# Patient Record
Sex: Female | Born: 1955 | Race: White | Hispanic: No | Marital: Married | Smoking: Never smoker
Health system: Southern US, Community
[De-identification: ages and names within clinical notes are randomized; demographics above are authoritative.]

## PROBLEM LIST (undated history)

## (undated) DIAGNOSIS — E119 Type 2 diabetes mellitus without complications: Secondary | ICD-10-CM

## (undated) DIAGNOSIS — I10 Essential (primary) hypertension: Secondary | ICD-10-CM

## (undated) HISTORY — PX: TUBAL LIGATION: SHX77

## (undated) HISTORY — PX: COLONOSCOPY: SHX174

---

## 2010-04-12 ENCOUNTER — Ambulatory Visit: Payer: Self-pay | Admitting: Family Medicine

## 2011-03-31 ENCOUNTER — Ambulatory Visit: Payer: Self-pay | Admitting: Family Medicine

## 2011-04-28 ENCOUNTER — Ambulatory Visit: Payer: Self-pay | Admitting: Family Medicine

## 2011-08-26 IMAGING — CR DG FOOT COMPLETE 3+V*L*
1 series · 3 of 3 positions shown · non-contrast
Comparison: none

REASON FOR EXAM: twisted foot
COMMENTS:

[Series 1: view not recorded · 0.17mm/px · 3 of 3 slices shown]
[im 1/3]
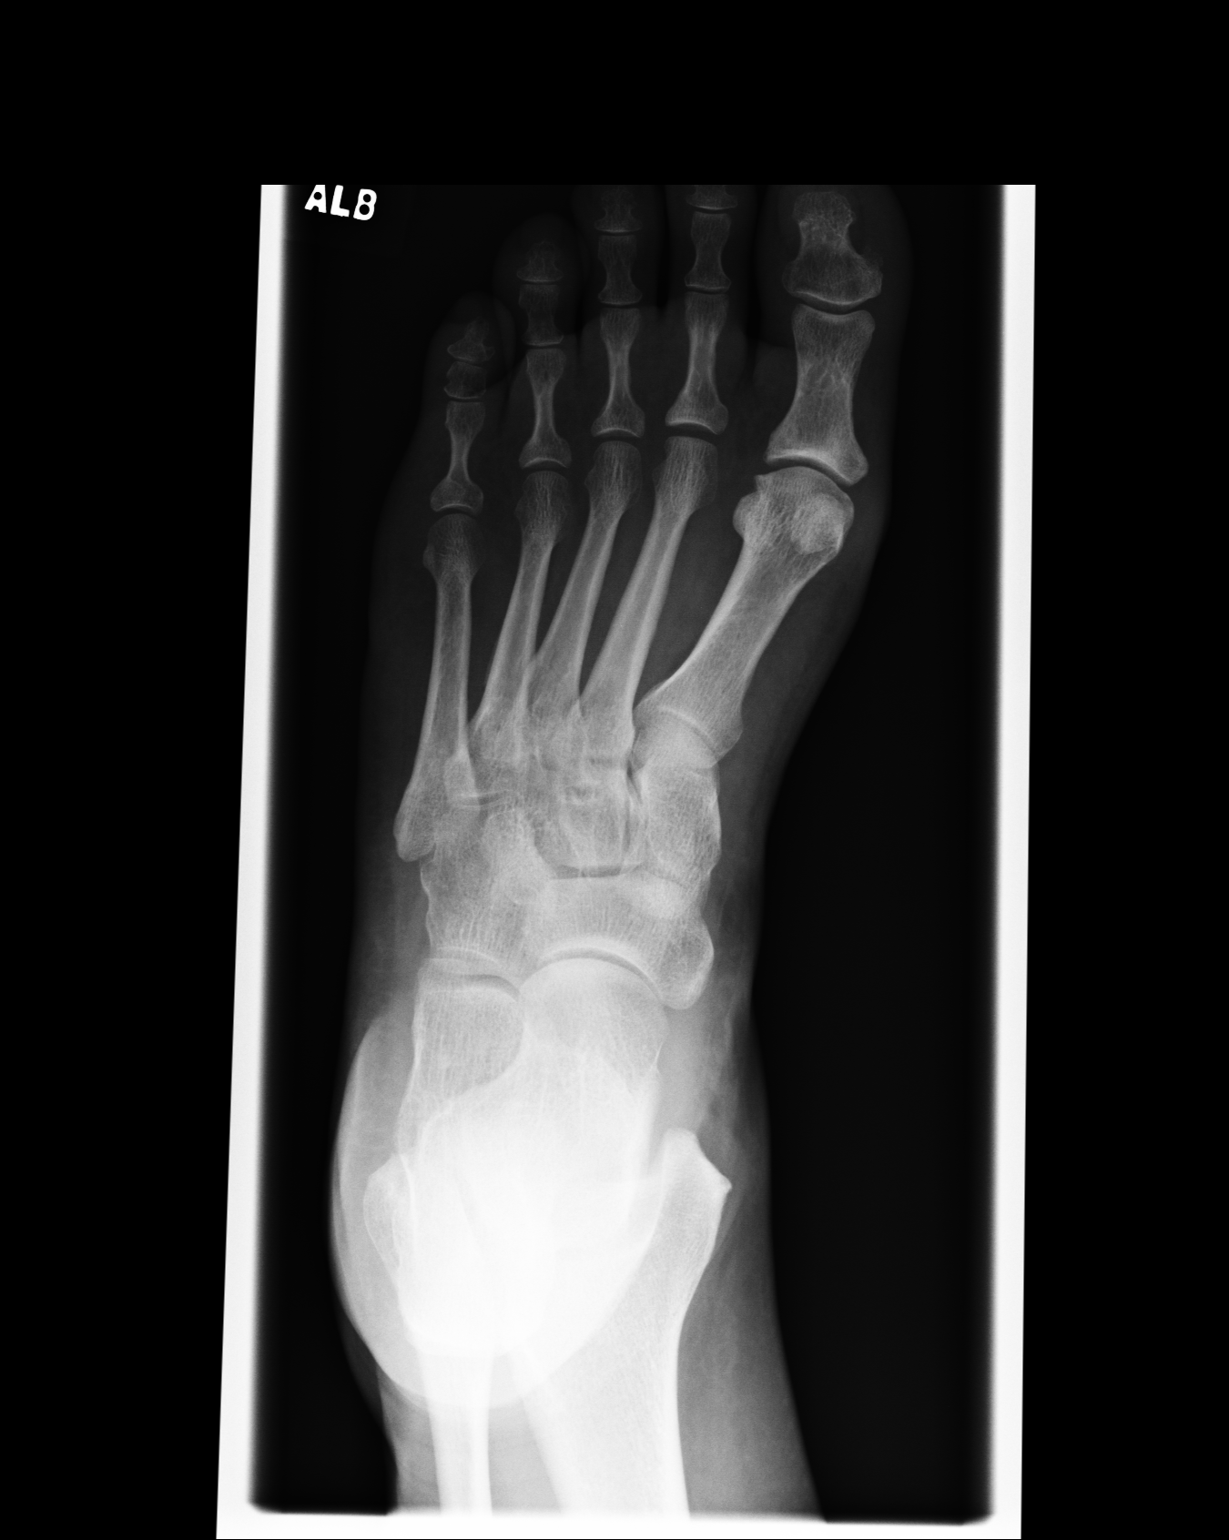
[im 2/3]
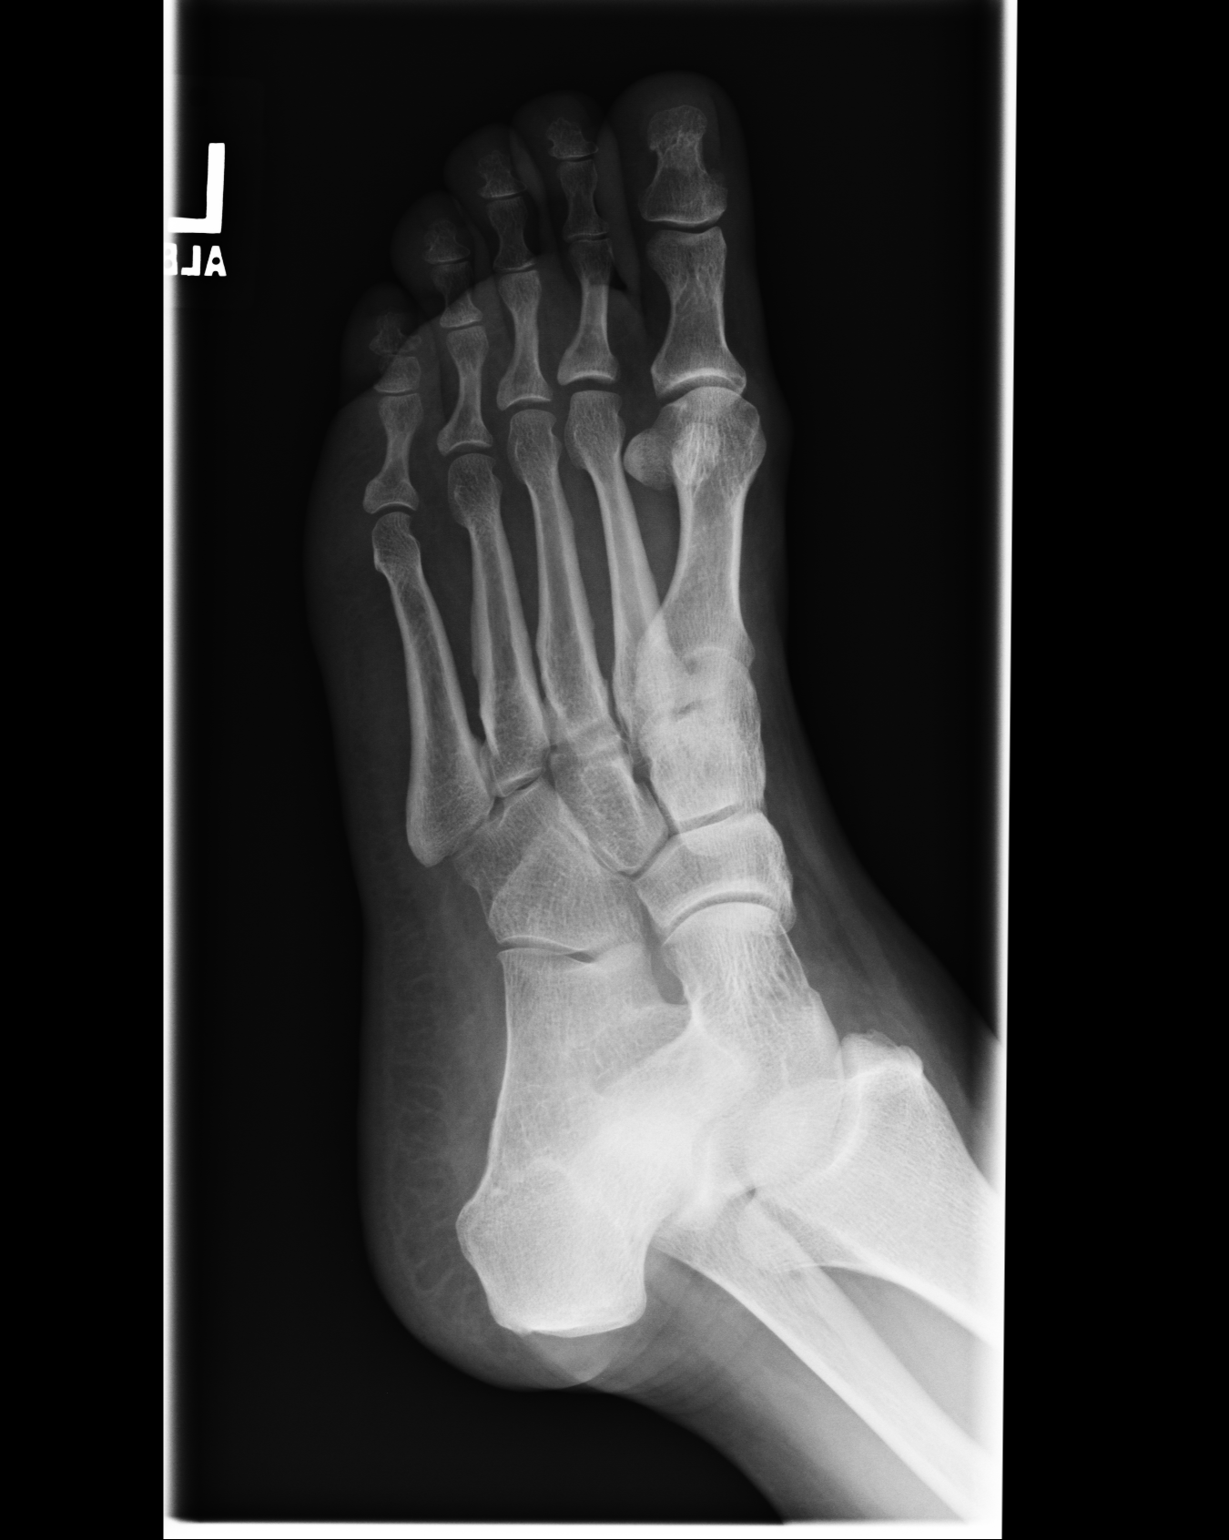
[im 3/3]
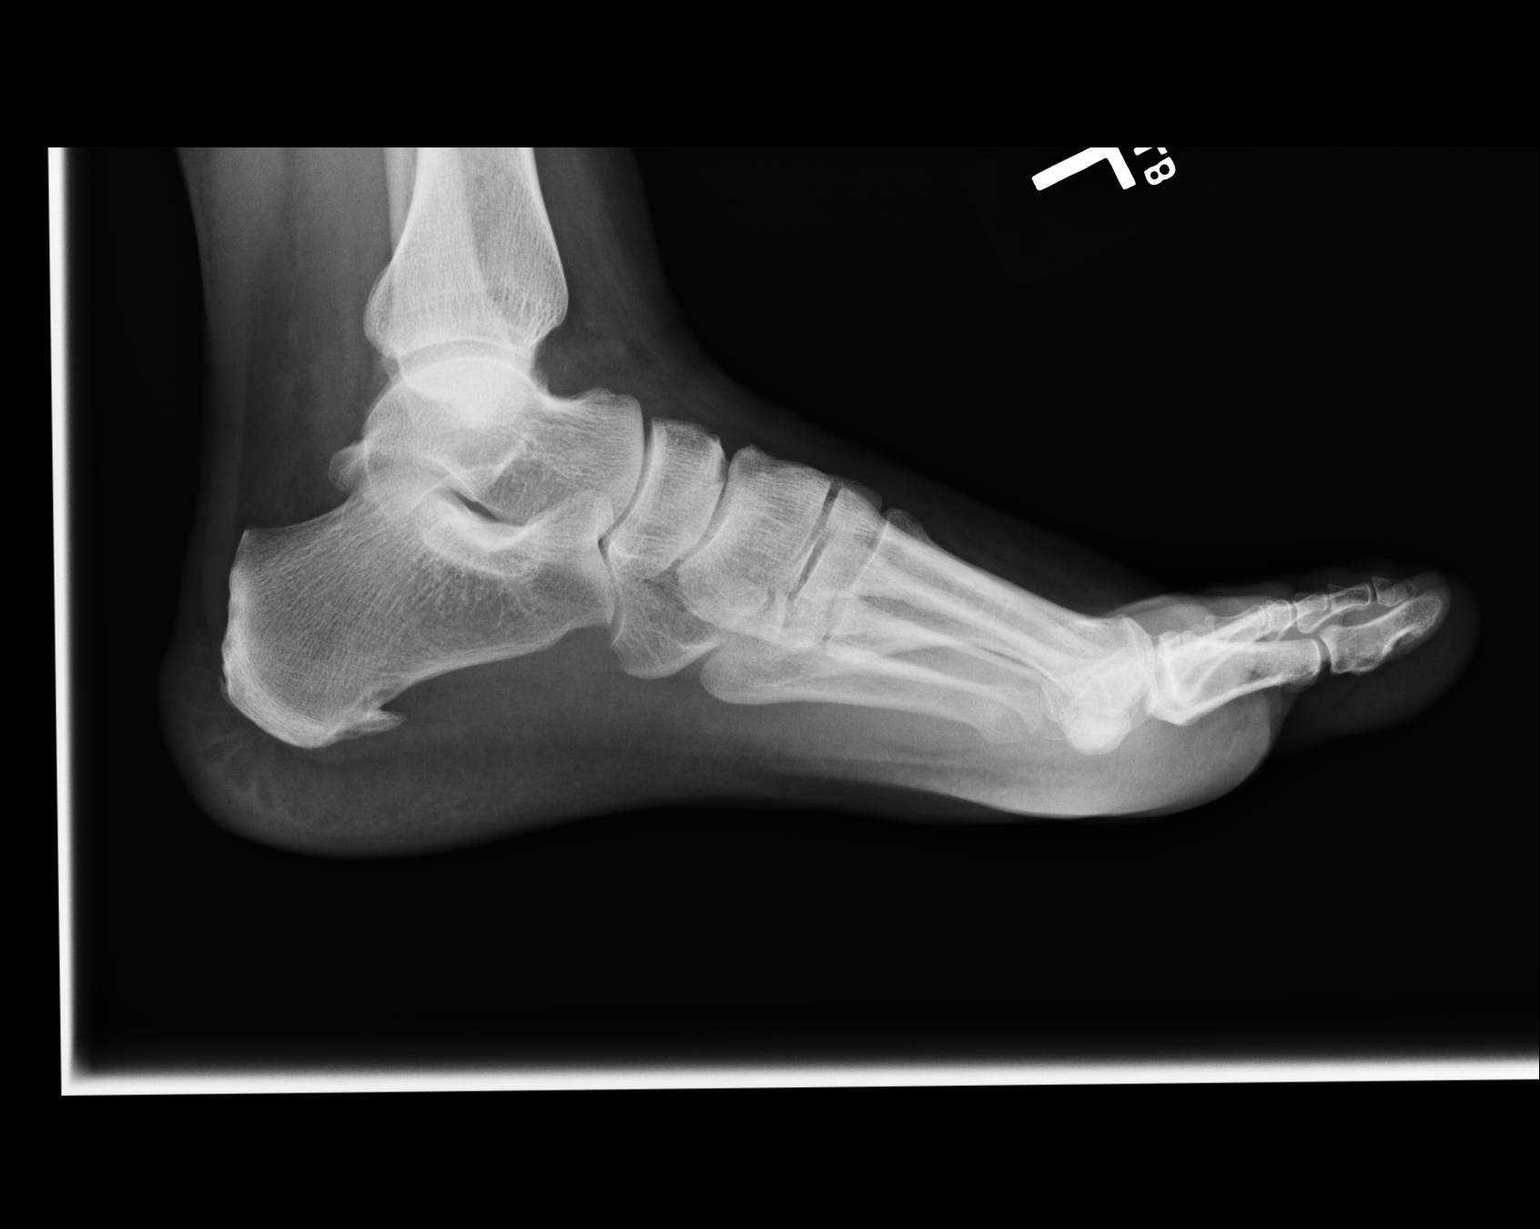

[3 of 3 positions shown; findings below may reference images not displayed]

PROCEDURE:     MDR - MDR FOOT LT COMP W/OBLQUES  - March 31, 2011  [DATE]

RESULT:     Three views of the left foot are submitted. The bones appear
well mineralized. The interphalangeal joints and the metatarsophalangeal
joints appear intact. The metatarsals exhibit no evidence of an acute
fracture nor dislocation. The tarsal bones exhibit no acute fracture. There
is a plantar calcaneal spur. The soft tissues the foot appear normal.
IMPRESSION: I see no acute bony abnormality of the left foot. If the
patient's symptoms persist and remain unexplained, followup imaging is
available upon request.

## 2012-07-10 ENCOUNTER — Ambulatory Visit: Payer: Self-pay | Admitting: Family Medicine

## 2012-08-18 ENCOUNTER — Ambulatory Visit: Payer: Self-pay | Admitting: Family Medicine

## 2013-08-17 ENCOUNTER — Ambulatory Visit: Payer: Self-pay | Admitting: Physician Assistant

## 2013-10-25 ENCOUNTER — Ambulatory Visit: Payer: Self-pay | Admitting: Family Medicine

## 2014-04-17 ENCOUNTER — Ambulatory Visit: Payer: Self-pay

## 2014-04-17 LAB — RAPID STREP-A WITH REFLX: Micro Text Report: NEGATIVE

## 2014-04-21 LAB — BETA STREP CULTURE(ARMC)

## 2015-03-11 ENCOUNTER — Other Ambulatory Visit: Payer: Self-pay | Admitting: Family Medicine

## 2015-03-11 ENCOUNTER — Ambulatory Visit
Admission: RE | Admit: 2015-03-11 | Discharge: 2015-03-11 | Disposition: A | Discharge status: Home / Self Care | Payer: BLUE CROSS/BLUE SHIELD | Source: Ambulatory Visit | Attending: Family Medicine | Admitting: Family Medicine

## 2015-03-11 DIAGNOSIS — Z1231 Encounter for screening mammogram for malignant neoplasm of breast: Secondary | ICD-10-CM | POA: Insufficient documentation

## 2015-05-27 ENCOUNTER — Encounter
Admission: RE | Admit: 2015-05-27 | Discharge: 2015-05-27 | Disposition: A | Discharge status: Home / Self Care | Payer: BLUE CROSS/BLUE SHIELD | Source: Ambulatory Visit | Attending: Obstetrics and Gynecology | Admitting: Obstetrics and Gynecology

## 2015-05-27 DIAGNOSIS — N840 Polyp of corpus uteri: Secondary | ICD-10-CM | POA: Insufficient documentation

## 2015-05-27 DIAGNOSIS — N950 Postmenopausal bleeding: Secondary | ICD-10-CM | POA: Insufficient documentation

## 2015-05-27 DIAGNOSIS — Z01818 Encounter for other preprocedural examination: Secondary | ICD-10-CM | POA: Diagnosis present

## 2015-05-27 HISTORY — DX: Essential (primary) hypertension: I10

## 2015-05-27 LAB — CBC
HCT: 41.9 % (ref 35.0–47.0)
Hemoglobin: 14.1 g/dL (ref 12.0–16.0)
MCH: 31 pg (ref 26.0–34.0)
MCHC: 33.7 g/dL (ref 32.0–36.0)
MCV: 91.9 fL (ref 80.0–100.0)
Platelets: 240 10*3/uL (ref 150–440)
RBC: 4.55 MIL/uL (ref 3.80–5.20)
RDW: 12.9 % (ref 11.5–14.5)
WBC: 7 10*3/uL (ref 3.6–11.0)

## 2015-05-27 LAB — BASIC METABOLIC PANEL
Anion gap: 8 (ref 5–15)
BUN: 15 mg/dL (ref 6–20)
CALCIUM: 9.4 mg/dL (ref 8.9–10.3)
CHLORIDE: 104 mmol/L (ref 101–111)
CO2: 28 mmol/L (ref 22–32)
CREATININE: 0.7 mg/dL (ref 0.44–1.00)
GFR calc Af Amer: >60 mL/min (ref >60)
GFR calc non Af Amer: >60 mL/min (ref >60)
GLUCOSE: 112 mg/dL — AB (ref 65–99)
Potassium: 4.1 mmol/L (ref 3.5–5.1)
Sodium: 140 mmol/L (ref 135–145)

## 2015-05-27 LAB — ABO/RH: ABO/RH(D): O POS

## 2015-05-27 NOTE — H&P (Signed)
  Katherine Caldwell is a 59 y.o. female here Fractional D+C +endometrial polypectomy  . Marland Kitchen PT underwent an EMBX which was negative . Previous cervical polypectomy .   Past Medical History:  has a past medical history of Hypertension and Hyperglycemia (06/27/2012).  Past Surgical History:  has past surgical history that includes no surgical history and Tubal ligation. Family History: family history includes Cervical cancer in her sister; Diabetes type II in her brother, father, and mother; Glaucoma in her father; Hypertension in her father and mother; Stroke in her father and mother. Social History:  reports that she has never smoked. She has never used smokeless tobacco. She reports that she drinks alcohol. She reports that she does not use illicit drugs. OB/GYN History:  OB History    Gravida Para Term Preterm AB TAB SAB Ectopic Multiple Living   Allergies: is allergic to aspirin. Medications:  Current outpatient prescriptions:  . amLODIPine (NORVASC) 5 MG tablet, Take 1 tablet (5 mg total) by mouth once daily., Disp: 30 tablet, Rfl: 11 . atenolol (TENORMIN) 50 MG tablet, Take 1 tablet (50 mg total) by mouth once daily., Disp: 30 tablet, Rfl: 11 . EPINEPHrine (EPIPEN) 0.3 mg/0.3 mL pen injector, Inject 0.3 mg into the muscle once as needed for Anaphylaxis., Disp: , Rfl:  . fluticasone (FLONASE) 50 mcg/actuation nasal spray, Place 2 sprays into both nostrils 2 (two) times daily., Disp: 16 g, Rfl: 2 . medroxyPROGESTERone (PROVERA) 10 MG tablet, Take 1 tablet (10 mg total) by mouth once daily., Disp: 10 tablet, Rfl: 0  Review of Systems: General:   No fatigue or weight loss Eyes:   No vision changes Ears:   No hearing difficulty Respiratory:   No cough or shortness of breath Pulmonary:   No asthma or shortness of breath Cardiovascular:  No chest pain, palpitations, dyspnea on exertion Gastrointestinal:  No abdominal bloating, chronic diarrhea,  constipations, masses, pain or hematochezia Genitourinary:  No hematuria, dysuria, abnormal vaginal discharge, pelvic pain, Menometrorrhagia Lymphatic:  No swollen lymph nodes Musculoskeletal: No muscle weakness Neurologic:  No extremity weakness, syncope, seizure disorder Psychiatric:  No history of depression, delusions or suicidal/homicidal ideation   Exam:      Filed Vitals:   05/27/2015  BP: 149/83    Body mass index is 33.28 kg/(m^2).  WDWN white/ black female in NAD  Lungs: CTA  CV : RRR without murmur  Pelvic: tanner stage 5 ,  External genitalia: vulva /labia no lesions Urethra: no prolapse Vagina: normal physiologic d/c Cervix: , no cervical motion tenderness,small endocx polyp noted   Uterus: normal size shape and contour, non-tender Adnexa: no mass, non-tender  SIS Endometrial polyp seen 6x4x7 mm, otherwise fibroid noted 18x11x23 mm  Impression:   The primary encounter diagnosis was PMB (postmenopausal bleeding). Diagnoses of Cervical polyp and Endometrial polyp     Plan:   Fx D+C and endometrial polypectomy. Pt has been counseled regarding the risks of the procedure . All questions answered .

## 2015-05-27 NOTE — Patient Instructions (Signed)
  Your procedure is scheduled on: 06/01/15 Report to Day Surgery. To find out your arrival time please call [phone] between 1PM - 3PM on 05/29/15.  Remember: Instructions that are not followed completely may result in serious medical risk, up to and including death, or upon the discretion of your surgeon and anesthesiologist your surgery may need to be rescheduled.    __x__ 1. Do not eat food or drink liquids after midnight. No gum chewing or hard candies.     __x__ 2. No Alcohol for 24 hours before or after surgery.   ____ 3. Bring all medications with you on the day of surgery if instructed.    __x__ 4. Notify your doctor if there is any change in your medical condition     (cold, fever, infections).     Do not wear jewelry, make-up, hairpins, clips or nail polish.  Do not wear lotions, powders, or perfumes. You may wear deodorant.  Do not shave 48 hours prior to surgery. Men may shave face and neck.  Do not bring valuables to the hospital.    [facility] is not responsible for any belongings or valuables.               Contacts, dentures or bridgework may not be worn into surgery.  Leave your suitcase in the car. After surgery it may be brought to your room.  For patients admitted to the hospital, discharge time is determined by your                treatment team.   Patients discharged the day of surgery will not be allowed to drive home.   Please read over the following fact sheets that you were given:   Surgical Site Infection Prevention   ____ Take these medicines the morning of surgery with A SIP OF WATER:    1. amlodipine  2. atenolol  3.   4.  5.  6.  ____ Fleet Enema (as directed)   ____ Use CHG Soap as directed  ____ Use inhalers on the day of surgery  ____ Stop metformin 2 days prior to surgery    ____ Take 1/2 of usual insulin dose the night before surgery and none on the morning of surgery.   ____ Stop Coumadin/Plavix/aspirin on  ____ Stop  Anti-inflammatories on   ____ Stop supplements until after surgery.    ____ Bring C-Pap to the hospital.

## 2015-05-28 LAB — TYPE AND SCREEN
ABO/RH(D): O POS
Antibody Screen: NEGATIVE

## 2015-06-01 ENCOUNTER — Ambulatory Visit: Payer: BLUE CROSS/BLUE SHIELD | Admitting: Anesthesiology

## 2015-06-01 ENCOUNTER — Encounter
Admission: RE | Disposition: A | Discharge status: Home / Self Care | Payer: Self-pay | Source: Ambulatory Visit | Attending: Obstetrics and Gynecology

## 2015-06-01 ENCOUNTER — Encounter: Payer: Self-pay | Admitting: *Deleted

## 2015-06-01 ENCOUNTER — Ambulatory Visit
Admission: RE | Admit: 2015-06-01 | Discharge: 2015-06-01 | Disposition: A | Discharge status: Home / Self Care | Payer: BLUE CROSS/BLUE SHIELD | Source: Ambulatory Visit | Attending: Obstetrics and Gynecology | Admitting: Obstetrics and Gynecology

## 2015-06-01 DIAGNOSIS — Z79899 Other long term (current) drug therapy: Secondary | ICD-10-CM | POA: Insufficient documentation

## 2015-06-01 DIAGNOSIS — N840 Polyp of corpus uteri: Secondary | ICD-10-CM | POA: Insufficient documentation

## 2015-06-01 DIAGNOSIS — R739 Hyperglycemia, unspecified: Secondary | ICD-10-CM | POA: Insufficient documentation

## 2015-06-01 DIAGNOSIS — Z8249 Family history of ischemic heart disease and other diseases of the circulatory system: Secondary | ICD-10-CM | POA: Diagnosis not present

## 2015-06-01 DIAGNOSIS — Z833 Family history of diabetes mellitus: Secondary | ICD-10-CM | POA: Diagnosis not present

## 2015-06-01 DIAGNOSIS — Z8049 Family history of malignant neoplasm of other genital organs: Secondary | ICD-10-CM | POA: Insufficient documentation

## 2015-06-01 DIAGNOSIS — Z820 Family history of epilepsy and other diseases of the nervous system: Secondary | ICD-10-CM | POA: Diagnosis not present

## 2015-06-01 DIAGNOSIS — Z886 Allergy status to analgesic agent status: Secondary | ICD-10-CM | POA: Insufficient documentation

## 2015-06-01 DIAGNOSIS — I10 Essential (primary) hypertension: Secondary | ICD-10-CM | POA: Insufficient documentation

## 2015-06-01 DIAGNOSIS — Z823 Family history of stroke: Secondary | ICD-10-CM | POA: Diagnosis not present

## 2015-06-01 DIAGNOSIS — N950 Postmenopausal bleeding: Secondary | ICD-10-CM | POA: Diagnosis not present

## 2015-06-01 DIAGNOSIS — N841 Polyp of cervix uteri: Secondary | ICD-10-CM | POA: Diagnosis not present

## 2015-06-01 HISTORY — PX: HYSTEROSCOPY WITH D & C: SHX1775

## 2015-06-01 SURGERY — DILATATION AND CURETTAGE /HYSTEROSCOPY
Anesthesia: General | Wound class: Clean Contaminated

## 2015-06-01 MED ORDER — ONDANSETRON HCL 4 MG/2ML IJ SOLN
4.0000 mg | Freq: Once | INTRAMUSCULAR | Status: AC | PRN
  Administered 2015-06-01: 4 mg via INTRAVENOUS

## 2015-06-01 MED ORDER — FENTANYL CITRATE (PF) 100 MCG/2ML IJ SOLN
INTRAMUSCULAR | Status: DC | PRN
  Administered 2015-06-01: 100 ug via INTRAVENOUS

## 2015-06-01 MED ORDER — DEXTROSE 5 % IV SOLN
1.0000 g | INTRAVENOUS | Status: AC
  Administered 2015-06-01: 1 g via INTRAVENOUS
  Filled 2015-06-01: qty 1

## 2015-06-01 MED ORDER — MIDAZOLAM HCL 2 MG/2ML IJ SOLN
INTRAMUSCULAR | Status: DC | PRN
  Administered 2015-06-01: 2 mg via INTRAVENOUS

## 2015-06-01 MED ORDER — SILVER NITRATE-POT NITRATE 75-25 % EX MISC
CUTANEOUS | Status: DC | PRN
  Administered 2015-06-01: 6
  Administered 2015-06-01: 2

## 2015-06-01 MED ORDER — SODIUM CHLORIDE 0.9 % IV SOLN
INTRAVENOUS | Status: DC | PRN
  Administered 2015-06-01 (×2): via INTRAVENOUS

## 2015-06-01 MED ORDER — FENTANYL CITRATE (PF) 100 MCG/2ML IJ SOLN
INTRAMUSCULAR | Status: AC
  Filled 2015-06-01: qty 2

## 2015-06-01 MED ORDER — DEXAMETHASONE SODIUM PHOSPHATE 4 MG/ML IJ SOLN
INTRAMUSCULAR | Status: DC | PRN
  Administered 2015-06-01: 10 mg via INTRAVENOUS

## 2015-06-01 MED ORDER — FENTANYL CITRATE (PF) 100 MCG/2ML IJ SOLN
25.0000 ug | INTRAMUSCULAR | Status: DC | PRN
  Administered 2015-06-01 (×2): 25 ug via INTRAVENOUS

## 2015-06-01 MED ORDER — FAMOTIDINE 20 MG PO TABS
ORAL_TABLET | ORAL | Status: AC
  Filled 2015-06-01: qty 1

## 2015-06-01 MED ORDER — ONDANSETRON HCL 4 MG/2ML IJ SOLN
INTRAMUSCULAR | Status: AC
  Filled 2015-06-01: qty 2

## 2015-06-01 MED ORDER — PROPOFOL 10 MG/ML IV BOLUS
INTRAVENOUS | Status: DC | PRN
  Administered 2015-06-01: 30 mg via INTRAVENOUS
  Administered 2015-06-01: 20 mg via INTRAVENOUS
  Administered 2015-06-01: 150 mg via INTRAVENOUS

## 2015-06-01 MED ORDER — FAMOTIDINE 20 MG PO TABS
20.0000 mg | ORAL_TABLET | Freq: Once | ORAL | Status: AC
  Administered 2015-06-01: 20 mg via ORAL

## 2015-06-01 MED ORDER — ONDANSETRON HCL 4 MG/2ML IJ SOLN
INTRAMUSCULAR | Status: DC | PRN
  Administered 2015-06-01: 4 mg via INTRAVENOUS

## 2015-06-01 MED ORDER — LACTATED RINGERS IV SOLN
INTRAVENOUS | Status: DC
  Administered 2015-06-01 (×2): via INTRAVENOUS

## 2015-06-01 MED ORDER — LIDOCAINE HCL (CARDIAC) 20 MG/ML IV SOLN
INTRAVENOUS | Status: DC | PRN
  Administered 2015-06-01: 100 mg via INTRAVENOUS

## 2015-06-01 SURGICAL SUPPLY — 19 items
CANISTER SUC SOCK COL 7IN (MISCELLANEOUS) ×3 IMPLANT
CANISTER SUCT 3000ML (MISCELLANEOUS) ×3 IMPLANT
CATH ROBINSON RED A/P 16FR (CATHETERS) ×3 IMPLANT
GLOVE BIO SURGEON STRL SZ8 (GLOVE) ×3 IMPLANT
GOWN STRL REUS W/ TWL LRG LVL3 (GOWN DISPOSABLE) ×1 IMPLANT
GOWN STRL REUS W/ TWL XL LVL3 (GOWN DISPOSABLE) ×1 IMPLANT
GOWN STRL REUS W/TWL LRG LVL3 (GOWN DISPOSABLE) ×2
GOWN STRL REUS W/TWL XL LVL3 (GOWN DISPOSABLE) ×2
KIT RM TURNOVER CYSTO AR (KITS) ×3 IMPLANT
MYOSURE LITE POLYP REMOVAL (MISCELLANEOUS) ×6 IMPLANT
PACK DNC HYST (MISCELLANEOUS) ×3 IMPLANT
PAD OB MATERNITY 4.3X12.25 (PERSONAL CARE ITEMS) ×3 IMPLANT
PAD PREP 24X41 OB/GYN DISP (PERSONAL CARE ITEMS) ×3 IMPLANT
SOL .9 NS 3000ML IRR  AL (IV SOLUTION) ×2
SOL .9 NS 3000ML IRR UROMATIC (IV SOLUTION) ×1 IMPLANT
TOWEL OR 17X26 4PK STRL BLUE (TOWEL DISPOSABLE) ×3 IMPLANT
TUBING CONNECTING 10 (TUBING) ×2 IMPLANT
TUBING CONNECTING 10' (TUBING) ×1
TUBING HYSTEROSCOPY DOLPHIN (MISCELLANEOUS) ×3 IMPLANT

## 2015-06-01 NOTE — Brief Op Note (Signed)
06/01/2015  11:19 AM  PATIENT:  Katherine Caldwell  59 y.o. female  PRE-OPERATIVE DIAGNOSIS:  PMB, ENDOMETRIAL POLYP  POST-OPERATIVE DIAGNOSIS:  PMB, ENDOMETRIAL POLYP, endocervical polyp  PROCEDURE:  Procedure(s): DILATATION AND CURETTAGE /HYSTEROSCOPY/MYOSURE (N/A), resection of endometrial polyps and cervical polypectomy   SURGEON:  Surgeon(s) and Role:    Suzy Bouchard, MD - Primary  PHYSICIAN ASSISTANT:   ASSISTANTS: none   ANESTHESIA:   general  EBL:  Total I/O In: 400 [I.V.:400] Out: 125 [Urine:100; Blood:25]  BLOOD ADMINISTERED:none  DRAINS: none   LOCAL MEDICATIONS USED:  NONE  SPECIMEN:  Source of Specimen:  endocervical polypectomy , endometrial polyp resection , endometrial curettings  DISPOSITION OF SPECIMEN:  PATHOLOGY  COUNTS:  YES  TOURNIQUET:  * No tourniquets in log *  DICTATION: .Other Dictation: Dictation Number verbal   PLAN OF CARE: Discharge to home after PACU  PATIENT DISPOSITION:  PACU - hemodynamically stable.   Delay start of Pharmacological VTE agent (>24hrs) due to surgical blood loss or risk of bleeding: not applicable

## 2015-06-01 NOTE — Discharge Instructions (Signed)
AMBULATORY SURGERY  DISCHARGE INSTRUCTIONS   1) The drugs that you were given will stay in your system until tomorrow so for the next 24 hours you should not:  A) Drive an automobile B) Make any legal decisions C) Drink any alcoholic beverage   2) You may resume regular meals tomorrow.  Today it is better to start with liquids and gradually work up to solid foods.  You may eat anything you prefer, but it is better to start with liquids, then soup and crackers, and gradually work up to solid foods.   3) Please notify your doctor immediately if you have any unusual bleeding, trouble breathing, redness and pain at the surgery site, drainage, fever, or pain not relieved by medication.    Additional Instructions:   Please contact your physician with any problems or Same Day Surgery at [phone], Monday through Friday 6 am to 4 pm, or [place] number at [phone].    Dilation and Curettage, Care After Refer to this sheet in the next few weeks. These instructions provide you with information on caring for yourself after your procedure. Your health care provider may also give you more specific instructions. Your treatment has been planned according to current medical practices, but problems sometimes occur. Call your health care provider if you have any problems or questions after your procedure. WHAT TO EXPECT AFTER THE PROCEDURE After your procedure, it is typical to have light cramping and bleeding. This may last for 2 days to 2 weeks after the procedure. HOME CARE INSTRUCTIONS   Do not drive for 24 hours.  Wait 1 week before returning to strenuous activities.  Take your temperature 2 times a day for 4 days and write it down. Provide these temperatures to your health care provider if you develop a fever.  Avoid long periods of standing.  Avoid heavy lifting, pushing, or pulling. Do not lift anything heavier than 10 pounds (4.5 kg).  Limit stair climbing  to once or twice a day.  Take rest periods often.  You may resume your usual diet.  Drink enough fluids to keep your urine clear or pale yellow.  Your usual bowel function should return. If you have constipation, you may:  Take a mild laxative with permission from your health care provider.  Add fruit and bran to your diet.  Drink more fluids.  Take showers instead of baths until your health care provider gives you permission to take baths.  Do not go swimming or use a hot tub until your health care provider approves.  Try to have someone with you or available to you the first 24-48 hours, especially if you were given a general anesthetic.  Do not douche, use tampons, or have sex (intercourse) for 2 weeks after the procedure.  Only take over-the-counter or prescription medicines as directed by your health care provider. Do not take aspirin. It can cause bleeding.  Follow up with your health care provider as directed. SEEK MEDICAL CARE IF:   You have increasing cramps or pain that is not relieved with medicine.  You have abdominal pain that does not seem to be related to the same area of earlier cramping and pain.  You have bad smelling vaginal discharge.  You have a rash.  You are having problems with any medicine. SEEK IMMEDIATE MEDICAL CARE IF:   You have bleeding that is heavier than a normal menstrual period.  You have a fever.  You have chest pain.  You  have shortness of breath.  You feel dizzy or feel like fainting.  You pass out.  You have pain in your shoulder strap area.  You have heavy vaginal bleeding with or without blood clots. MAKE SURE YOU:   Understand these instructions.  Will watch your condition.  Will get help right away if you are not doing well or get worse.   This information is not intended to replace advice given to you by your health care provider. Make sure you discuss any questions you have with your health care provider.     Document Released: 08/05/2000 Document Revised: 08/13/2013 Document Reviewed: 03/07/2013 Elsevier Interactive Patient Education [employer]

## 2015-06-01 NOTE — Anesthesia Preprocedure Evaluation (Addendum)
Anesthesia Evaluation  Patient identified by MRN, date of birth, ID band Patient awake    Reviewed: Allergy & Precautions, NPO status , Patient's Chart, lab work & pertinent test results, reviewed documented beta blocker date and time   Airway Mallampati: II  TM Distance: <3 FB     Dental  (+) Caps   Pulmonary  Sinus allergies   Pulmonary exam normal breath sounds clear to auscultation       Cardiovascular hypertension, Pt. on medications and Pt. on home beta blockers Normal cardiovascular exam     Neuro/Psych negative neurological ROS     GI/Hepatic negative GI ROS, Neg liver ROS,   Endo/Other  negative endocrine ROS  Renal/GU negative Renal ROS  negative genitourinary   Musculoskeletal negative musculoskeletal ROS (+)   Abdominal Normal abdominal exam  (+)   Peds negative pediatric ROS (+)  Hematology negative hematology ROS (+)   Anesthesia Other Findings   Reproductive/Obstetrics                            Anesthesia Physical Anesthesia Plan  ASA: II  Anesthesia Plan: General   Post-op Pain Management:    Induction: Intravenous  Airway Management Planned: LMA  Additional Equipment:   Intra-op Plan:   Post-operative Plan: Extubation in OR  Informed Consent: I have reviewed the patients History and Physical, chart, labs and discussed the procedure including the risks, benefits and alternatives for the proposed anesthesia with the patient or authorized representative who has indicated his/her understanding and acceptance.   Dental advisory given  Plan Discussed with: CRNA and Surgeon  Anesthesia Plan Comments:         Anesthesia Quick Evaluation

## 2015-06-01 NOTE — Anesthesia Procedure Notes (Signed)
Procedure Name: LMA Insertion Date/Time: 06/01/2015 10:45 AM Performed by: Junious Silk Pre-anesthesia Checklist: Patient identified, Patient being monitored, Timeout performed, Emergency Drugs available and Suction available Patient Re-evaluated:Patient Re-evaluated prior to inductionOxygen Delivery Method: Circle system utilized Preoxygenation: Pre-oxygenation with 100% oxygen Intubation Type: IV induction Ventilation: Mask ventilation without difficulty LMA: LMA inserted LMA Size: 3.5 Tube type: Oral Number of attempts: 1 Placement Confirmation: positive ETCO2 and breath sounds checked- equal and bilateral Tube secured with: Tape Dental Injury: Teeth and Oropharynx as per pre-operative assessment

## 2015-06-01 NOTE — Progress Notes (Signed)
Pt interviewed , labs wnl . NPO . Ready for surgery . All questions answered

## 2015-06-01 NOTE — Transfer of Care (Signed)
Immediate Anesthesia Transfer of Care Note  Patient: Katherine Caldwell  Procedure(s) Performed: Procedure(s): DILATATION AND CURETTAGE /HYSTEROSCOPY/MYOSURE (N/A)  Patient Location: PACU  Anesthesia Type:General  Level of Consciousness: awake and sedated  Airway & Oxygen Therapy: Patient Spontanous Breathing and Patient connected to face mask oxygen  Post-op Assessment: Report given to RN and Post -op Vital signs reviewed and stable  Post vital signs: Reviewed and stable  Last Vitals:  Filed Vitals:   06/01/15 0826  BP: 152/81  Pulse: 55  Temp: 36.7 C  Resp: 16    Complications: No apparent anesthesia complications

## 2015-06-02 LAB — SURGICAL PATHOLOGY

## 2015-06-02 NOTE — Anesthesia Postprocedure Evaluation (Signed)
  Anesthesia Post-op Note  Patient: Katherine Caldwell  Procedure(s) Performed: Procedure(s): DILATATION AND CURETTAGE /HYSTEROSCOPY/MYOSURE (N/A)  Anesthesia type:General  Patient location: PACU  Post pain: Pain level controlled  Post assessment: Post-op Vital signs reviewed, Patient's Cardiovascular Status Stable, Respiratory Function Stable, Patent Airway and No signs of Nausea or vomiting  Post vital signs: Reviewed and stable  Last Vitals:  Filed Vitals:   06/01/15 1401  BP: 140/74  Pulse: 64  Temp: 36.1 C  Resp: 16    Level of consciousness: awake, alert  and patient cooperative  Complications: No apparent anesthesia complications

## 2015-06-02 NOTE — Op Note (Signed)
NAMEGWYNDOLYN, Katherine Caldwell NO.:  [id number]  MEDICAL RECORD NO.:  [id number]  LOCATION:  [facility]                         FACILITY:  [facility]  PHYSICIAN:  Jennell Corner, MDDATE OF BIRTH:  06-18-1956  DATE OF PROCEDURE: DATE OF DISCHARGE:  06/01/2015                              OPERATIVE REPORT   PREOPERATIVE DIAGNOSIS: 1. Postmenopausal bleeding. 2. Endometrial and endocervical polyps.  POSTOPERATIVE DIAGNOSIS: 1. Postmenopausal bleeding. 2. Endometrial and endocervical polyps.  PROCEDURE PERFORMED: 1. Fractional dilation and curettage. 2. Endocervical polypectomy. 3. Endometrial resection of endometrial polyps with MyoSure.  SURGEON:  Jennell Corner, MD  ANESTHESIA:  LMA.  SURGEON:  Jennell Corner, M.D.  INDICATIONS:  A 59 year old, gravida 4, para 3 patient with postmenopausal bleeding and evidence, on saline infusion sonohysterography of endometrial polyps.  DESCRIPTION OF PROCEDURE:  After adequate LMA anesthesia, the patient was placed in dorsal supine position with the legs in the candy-cane stirrups.  The patient's abdomen, perineum, and vagina were prepped and draped in normal sterile fashion.  Prophylactic antibiotics with Ancef 1 g administered.  Time-out was performed.  A weighted speculum was placed in the posterior vaginal vault.  Anterior cervix was grasped with a single-tooth tenaculum.  Endocervical curettage was performed followed by removal of endocervical polyp, approximately 5 x 5 mm.  The cervix was then sounded to 8 cm and cervix was then dilated to #18 Hanks dilator.  Hysteroscope was advanced into the endometrial cavity.  Normal saline used as distending medium and there was evidence of 2 polyps, 1 approximately 7 x 6 mm left portion of the endometrial cavity and 1 was right anterior, measuring smaller probably by 4 x 4 mm.  The MyoSure was brought up into the operative field and advanced and MyoSure was used to remove  these polyps.  Good hemostasis was noted.  Intraoperative pictures were taken and the MyoSure and hysteroscope were removed.  Good hemostasis was noted.  Silver nitrate was applied to the tenaculum sites and the procedure was terminated.  INTRAOPERATIVE FLUIDS:  400 mL.  ESTIMATED BLOOD LOSS:  Minimal blood loss.  URINE OUTPUT:  100 mL.  The patient was catheterized prior to commencement of the case and yielded 100 mL of clear urine.  COMPLICATIONS:  None.  The patient was taken to the recovery room in good condition.          ______________________________ Jennell Corner, MD     TS/MEDQ  D:  06/01/2015  T:  06/02/2015  Job:  [id number]

## 2015-12-25 ENCOUNTER — Other Ambulatory Visit: Payer: Self-pay | Admitting: Family Medicine

## 2015-12-25 DIAGNOSIS — N644 Mastodynia: Secondary | ICD-10-CM

## 2016-01-04 ENCOUNTER — Ambulatory Visit
Admission: RE | Admit: 2016-01-04 | Discharge: 2016-01-04 | Disposition: A | Discharge status: Home / Self Care | Payer: BLUE CROSS/BLUE SHIELD | Source: Ambulatory Visit | Attending: Family Medicine | Admitting: Family Medicine

## 2016-01-04 DIAGNOSIS — N644 Mastodynia: Secondary | ICD-10-CM

## 2016-04-06 ENCOUNTER — Other Ambulatory Visit: Payer: Self-pay | Admitting: Gastroenterology

## 2016-04-06 DIAGNOSIS — R1310 Dysphagia, unspecified: Secondary | ICD-10-CM

## 2016-04-08 ENCOUNTER — Ambulatory Visit
Admission: RE | Admit: 2016-04-08 | Discharge: 2016-04-08 | Disposition: A | Discharge status: Home / Self Care | Payer: BLUE CROSS/BLUE SHIELD | Source: Ambulatory Visit | Attending: Gastroenterology | Admitting: Gastroenterology

## 2016-04-08 DIAGNOSIS — K219 Gastro-esophageal reflux disease without esophagitis: Secondary | ICD-10-CM | POA: Diagnosis not present

## 2016-04-08 DIAGNOSIS — R1310 Dysphagia, unspecified: Secondary | ICD-10-CM | POA: Diagnosis present

## 2016-05-31 IMAGING — MG MM DIAG BREAST TOMO UNI LEFT
7 series · 8 of 15 positions shown · non-contrast
Comparison: Previous examinations the most recent which is dated
03/11/2015.

CLINICAL DATA: The patient developed focal pain within the mid
axillary line of her left lateral chest wall following a viral
infection. She noticed no skin rash at this time. She is referred
for evaluation.

EXAM:
2D DIGITAL DIAGNOSTIC LEFT MAMMOGRAM WITH CAD AND ADJUNCT TOMO
ULTRASOUND LEFT BREAST

[L XCCL]
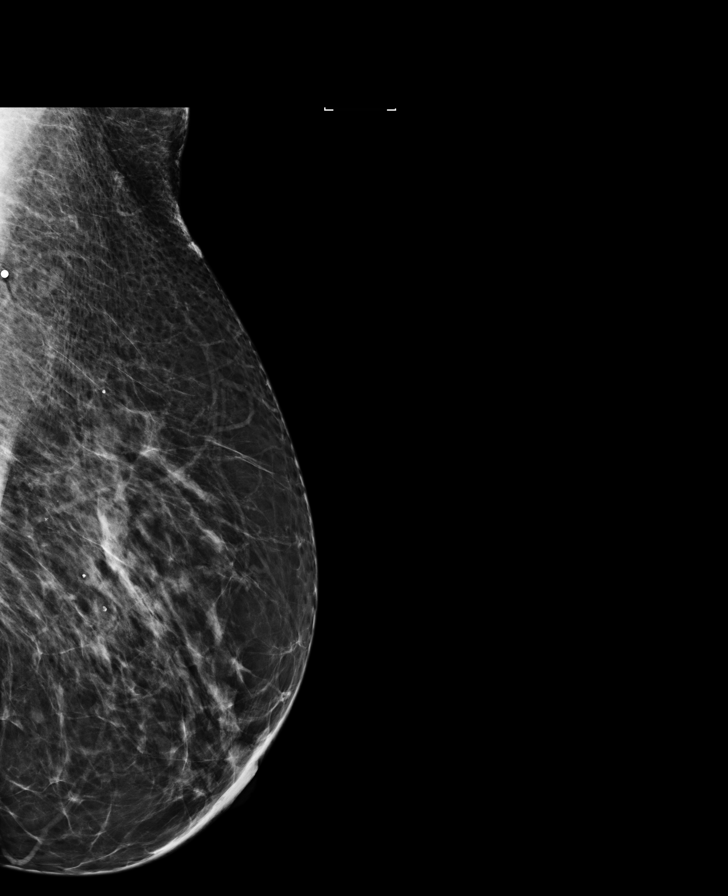

[L CC]
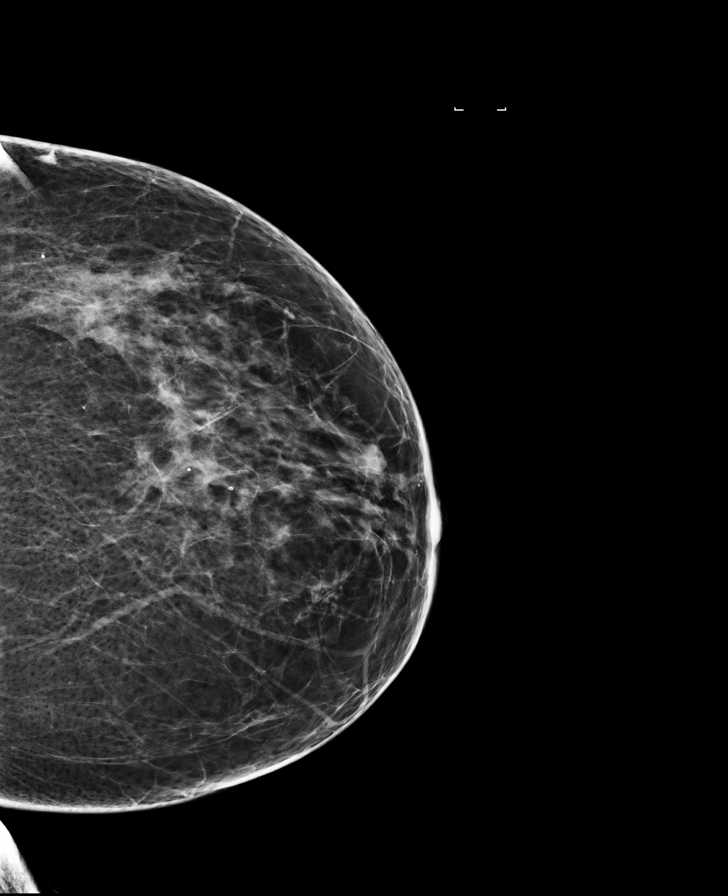

[L MLO]
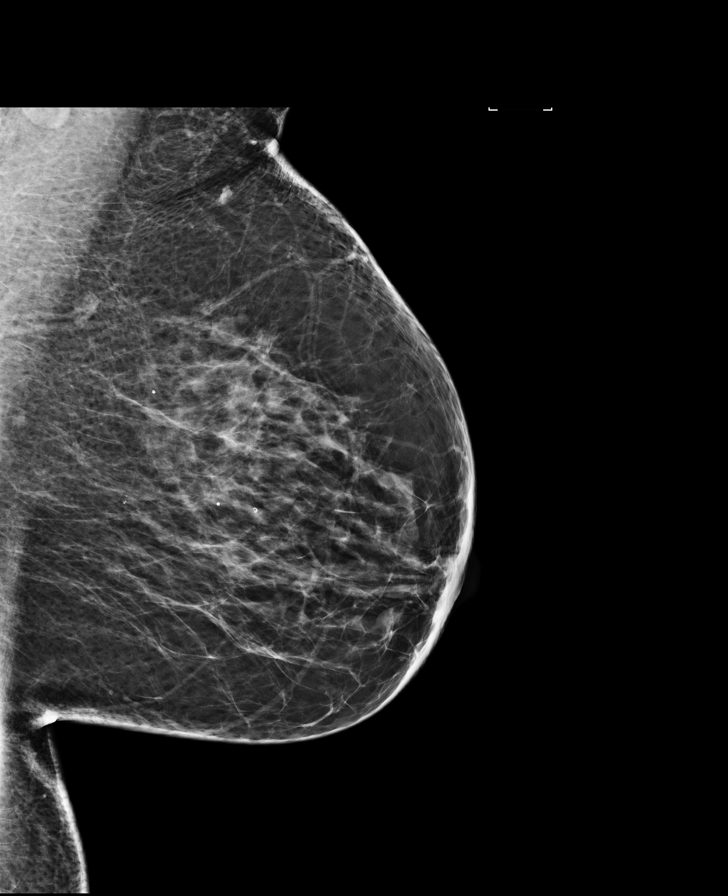

[L MLO synth-2D]
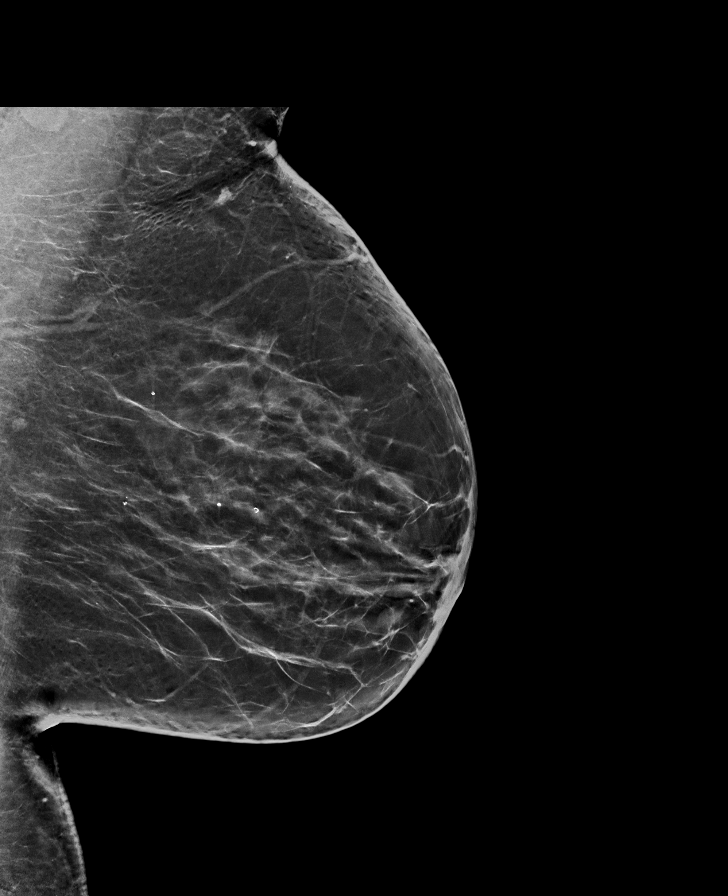

[L CC synth-2D]
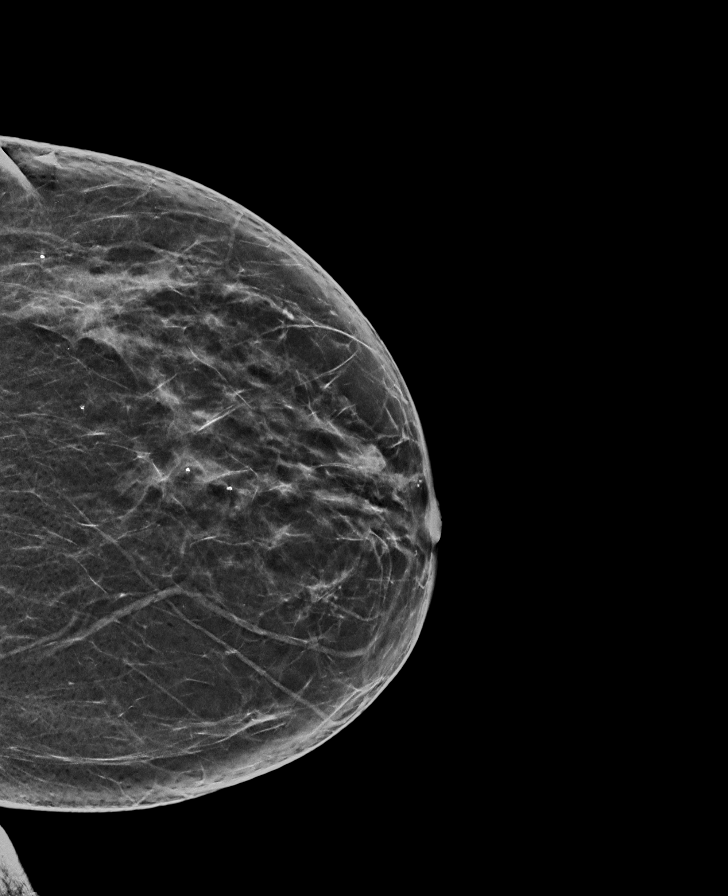

[L CC tomo · 2 of 70 frames shown]
[frame 23/70]
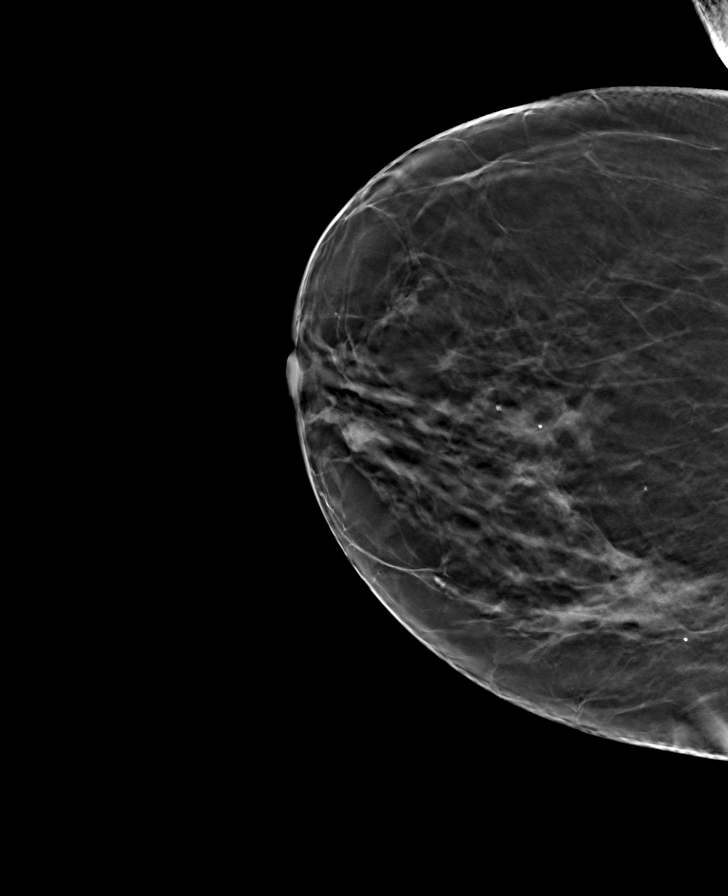
[frame 35/70]
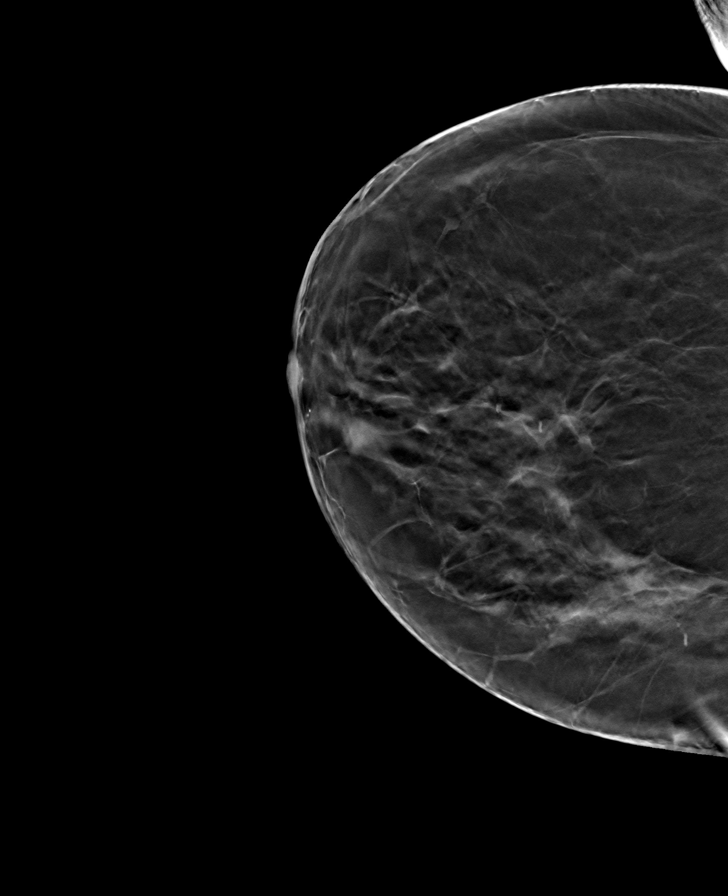

[L MLO tomo · tomo slice 43/85.0]
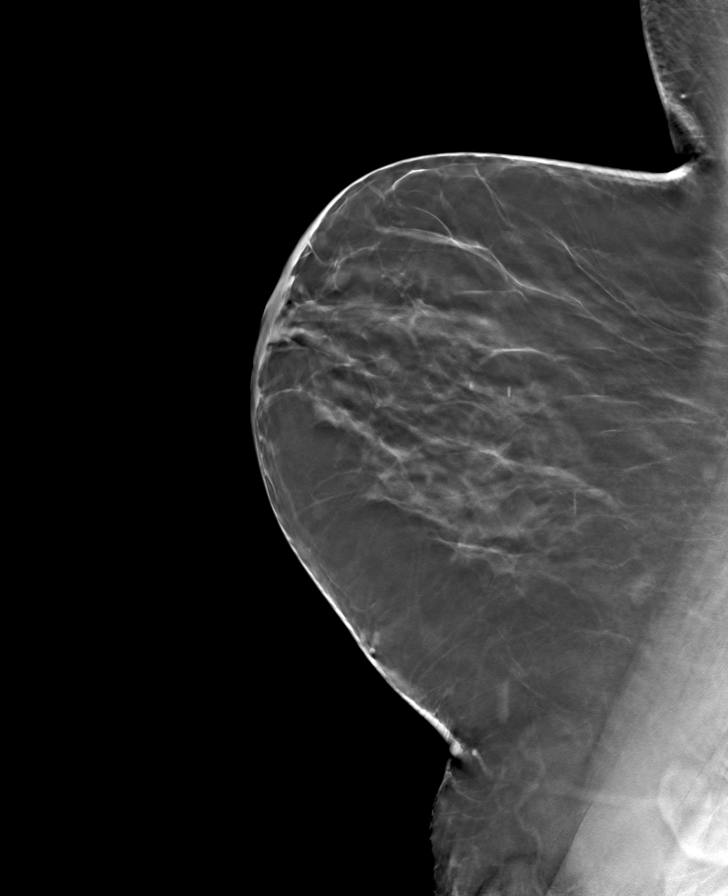

[8 of 15 positions shown; findings below may reference images not displayed]

ACR Breast Density Category b: There are scattered areas of
fibroglandular density.
FINDINGS: There is no mass, distortion, or worrisome calcification. The breast
parenchymal pattern is stable.

Mammographic images were processed with CAD.

On physical exam, there is no discrete palpable abnormality in the
area of focal tenderness with within the lateral left chest region.

Targeted ultrasound is performed, showing normal appearing
musculoskeletal soft tissues in the region of the patient's focal
pain. There is no mass or enlarged lymph nodes. Most likely the
patient's pain is on a musculoskeletal basis.
IMPRESSION: No findings worrisome for malignancy.  Please see above discussion.

RECOMMENDATION:
Bilateral screening mammography in February 2016.

I have discussed the findings and recommendations with the patient.
Results were also provided in writing at the conclusion of the
visit. If applicable, a reminder letter will be sent to the patient
regarding the next appointment.

BI-RADS CATEGORY  1: Negative.

## 2016-07-21 ENCOUNTER — Encounter: Payer: Self-pay | Admitting: *Deleted

## 2016-07-22 ENCOUNTER — Encounter
Admission: RE | Disposition: A | Discharge status: Home / Self Care | Payer: Self-pay | Source: Ambulatory Visit | Attending: Gastroenterology

## 2016-07-22 ENCOUNTER — Ambulatory Visit
Admission: RE | Admit: 2016-07-22 | Discharge: 2016-07-22 | Disposition: A | Discharge status: Home / Self Care | Payer: BLUE CROSS/BLUE SHIELD | Source: Ambulatory Visit | Attending: Gastroenterology | Admitting: Gastroenterology

## 2016-07-22 ENCOUNTER — Ambulatory Visit: Payer: BLUE CROSS/BLUE SHIELD | Admitting: Anesthesiology

## 2016-07-22 DIAGNOSIS — K2950 Unspecified chronic gastritis without bleeding: Secondary | ICD-10-CM | POA: Insufficient documentation

## 2016-07-22 DIAGNOSIS — Z79899 Other long term (current) drug therapy: Secondary | ICD-10-CM | POA: Diagnosis not present

## 2016-07-22 DIAGNOSIS — K210 Gastro-esophageal reflux disease with esophagitis: Secondary | ICD-10-CM | POA: Insufficient documentation

## 2016-07-22 DIAGNOSIS — Z7984 Long term (current) use of oral hypoglycemic drugs: Secondary | ICD-10-CM | POA: Diagnosis not present

## 2016-07-22 DIAGNOSIS — I10 Essential (primary) hypertension: Secondary | ICD-10-CM | POA: Insufficient documentation

## 2016-07-22 DIAGNOSIS — Z7951 Long term (current) use of inhaled steroids: Secondary | ICD-10-CM | POA: Diagnosis not present

## 2016-07-22 DIAGNOSIS — R1310 Dysphagia, unspecified: Secondary | ICD-10-CM | POA: Diagnosis not present

## 2016-07-22 DIAGNOSIS — E119 Type 2 diabetes mellitus without complications: Secondary | ICD-10-CM | POA: Insufficient documentation

## 2016-07-22 HISTORY — DX: Type 2 diabetes mellitus without complications: E11.9

## 2016-07-22 HISTORY — PX: ESOPHAGOGASTRODUODENOSCOPY (EGD) WITH PROPOFOL: SHX5813

## 2016-07-22 LAB — GLUCOSE, CAPILLARY: GLUCOSE-CAPILLARY: 114 mg/dL — AB (ref 65–99)

## 2016-07-22 SURGERY — ESOPHAGOGASTRODUODENOSCOPY (EGD) WITH PROPOFOL
Anesthesia: General

## 2016-07-22 MED ORDER — PROPOFOL 500 MG/50ML IV EMUL
INTRAVENOUS | Status: DC | PRN
  Administered 2016-07-22: 150 ug/kg/min via INTRAVENOUS

## 2016-07-22 MED ORDER — SODIUM CHLORIDE 0.9 % IV SOLN: INTRAVENOUS | Status: DC

## 2016-07-22 MED ORDER — LIDOCAINE HCL (CARDIAC) 20 MG/ML IV SOLN
INTRAVENOUS | Status: DC | PRN
  Administered 2016-07-22: 60 mg via INTRAVENOUS

## 2016-07-22 MED ORDER — MIDAZOLAM HCL 2 MG/2ML IJ SOLN
INTRAMUSCULAR | Status: DC | PRN
  Administered 2016-07-22: 2 mg via INTRAVENOUS

## 2016-07-22 MED ORDER — SODIUM CHLORIDE 0.9 % IV SOLN
INTRAVENOUS | Status: DC
  Administered 2016-07-22: 1000 mL via INTRAVENOUS

## 2016-07-22 MED ORDER — PROPOFOL 10 MG/ML IV BOLUS
INTRAVENOUS | Status: DC | PRN
  Administered 2016-07-22: 60 mg via INTRAVENOUS

## 2016-07-22 MED ORDER — GLYCOPYRROLATE 0.2 MG/ML IJ SOLN
INTRAMUSCULAR | Status: DC | PRN
  Administered 2016-07-22: 0.2 mg via INTRAVENOUS

## 2016-07-22 NOTE — H&P (Addendum)
Outpatient short stay form Pre-procedure 07/22/2016 8:54 AM Katherine Caldwell Carmie Lanpher MD  Primary Physician: Dr. Leim FabryBarbara Caldwell  Reason for visit:  EGD  History of present illness:  Patient is a 60 year old female presenting today as above. She has a history of some dysphagia but 1 discussed with patient is not dysphagia but more so or laryngeal spasm that is associated with airway he did have a barium swallow which shows a marked reflux but no evidence of stricture and a tablet passed without difficulty. She takes no blood thinning agents or aspirin products    Current Facility-Administered Medications:  .  0.9 %  sodium chloride infusion, , Intravenous, Continuous, Katherine Caldwell Ahsley Attwood, MD, Last Rate: 20 mL/hr at 07/22/16 0758, 1,000 mL at 07/22/16 0758 .  0.9 %  sodium chloride infusion, , Intravenous, Continuous, Katherine Caldwell Tamarion Haymond, MD  Prescriptions Prior to Admission  Medication Sig Dispense Refill Last Dose  . amLODipine (NORVASC) 5 MG tablet Take 5 mg by mouth daily.   07/21/2016 at Unknown time  . atenolol (TENORMIN) 50 MG tablet Take 50 mg by mouth daily.   07/21/2016 at Unknown time  . Diphenhydramine-Allantoin 2-0.5 % CREA Apply topically 2 (two) times daily.   07/21/2016 at Unknown time  . EPINEPHrine (EPIPEN 2-PAK) 0.3 mg/0.3 mL IJ SOAJ injection Inject into the muscle once. As needed   07/21/2016 at Unknown time  . fluticasone (FLONASE) 50 MCG/ACT nasal spray Place 1 spray into both nostrils daily.   07/21/2016 at Unknown time  . losartan (COZAAR) 25 MG tablet Take 25 mg by mouth daily.   07/21/2016 at Unknown time  . metFORMIN (GLUCOPHAGE-XR) 500 MG 24 hr tablet Take 500 mg by mouth daily with breakfast.   07/21/2016 at Unknown time  . omeprazole (PRILOSEC) 40 MG capsule Take 40 mg by mouth daily.   07/21/2016 at Unknown time     Allergies  Allergen Reactions  . Aspirin Hives  . Other     Unknown preservatives in salad     Past Medical History:  Diagnosis Date  . Diabetes  mellitus without complication (HCC)   . Hypertension     Review of systems:      Physical Exam    Heart and lungs: Regular rate and rhythm without rub or gallop, lungs are bilaterally clear.    HEENT: Normocephalic atraumatic eyes are anicteric    Other:     Pertinant exam for procedure: Soft nontender nondistended sounds positive normoactive.    Planned proceedures: EGD and indicated procedures. I have discussed the risks benefits and complications of procedures to include not limited to bleeding, infection, perforation and the risk of sedation and the patient wishes to proceed.    Katherine Caldwell Kimo Bancroft, MD Gastroenterology 07/22/2016  8:54 AM

## 2016-07-22 NOTE — Anesthesia Preprocedure Evaluation (Signed)
Anesthesia Evaluation  Patient identified by MRN, date of birth, ID band Patient awake    Reviewed: Allergy & Precautions, NPO status , Patient's Chart, lab work & pertinent test results  History of Anesthesia Complications Negative for: history of anesthetic complications  Airway Mallampati: III       Dental   Pulmonary neg pulmonary ROS,           Cardiovascular hypertension, Pt. on medications and Pt. on home beta blockers      Neuro/Psych negative neurological ROS     GI/Hepatic GERD  Medicated and Poorly Controlled,  Endo/Other  diabetes, Type 2, Oral Hypoglycemic Agents  Renal/GU      Musculoskeletal   Abdominal   Peds  Hematology   Anesthesia Other Findings   Reproductive/Obstetrics                             Anesthesia Physical Anesthesia Plan  ASA: III  Anesthesia Plan: General   Post-op Pain Management:    Induction: Intravenous  Airway Management Planned:   Additional Equipment:   Intra-op Plan:   Post-operative Plan:   Informed Consent: I have reviewed the patients History and Physical, chart, labs and discussed the procedure including the risks, benefits and alternatives for the proposed anesthesia with the patient or authorized representative who has indicated his/her understanding and acceptance.     Plan Discussed with:   Anesthesia Plan Comments:         Anesthesia Quick Evaluation

## 2016-07-22 NOTE — Op Note (Signed)
[facility] Gastroenterology Patient Name: Katherine LennertBarbara Delauder Procedure Date: 07/22/2016 8:57 AM MRN: [id number] Account #: [id number] Date of Birth: 08/11/1956 Admit Type: Outpatient Age: [age] [facility] Gender: Female Note Status: Finalized Procedure:            Upper GI endoscopy Indications:          Dysphagia, Gastro-esophageal reflux disease Providers:            Christena DeemMartin U. Skulskie, MD Referring MD:         Leim FabryBarbara Aldridge MD, MD (Referring MD) Medicines:            Monitored Anesthesia Care Complications:        No immediate complications. Procedure:            Pre-Anesthesia Assessment:                       - ASA Grade Assessment: III - A patient with severe                        systemic disease.                       After obtaining informed consent, the endoscope was                        passed under direct vision. Throughout the procedure,                        the patient's blood pressure, pulse, and oxygen                        saturations were monitored continuously. The Endoscope                        was introduced through the mouth, and advanced to the                        third part of duodenum. The upper GI endoscopy was                        accomplished without difficulty. The patient tolerated                        the procedure well. Findings:      The Z-line was regular. Biopsies were taken with a cold forceps for       histology.      The exam of the esophagus was otherwise normal.      Patchy minimal inflammation characterized by congestion (edema) and       erythema and tiny antral erosions was found in the gastric body and in       the gastric antrum. Biopsies were taken with a cold forceps for       histology.      The exam of the stomach was otherwise normal.      The cardia and gastric fundus were normal on retroflexion.      The examined duodenum was normal. Impression:           - Z-line regular. Biopsied.                      -  Gastritis. Biopsied.                       - Normal examined duodenum. Recommendation:       - Discharge patient to home.                       - Use Prilosec (omeprazole) 40 mg PO BID for 2 months.                       - Use Prilosec (omeprazole) 40 mg PO daily daily.                       - Await pathology results.                       - Return to GI clinic in 4 weeks. Procedure Code(s):    --- Professional ---                       [id number], Esophagogastroduodenoscopy, flexible, transoral;                        with biopsy, single or multiple Diagnosis Code(s):    --- Professional ---                       K29.70, Gastritis, unspecified, without bleeding                       R13.10, Dysphagia, unspecified                       K21.9, Gastro-esophageal reflux disease without                        esophagitis CPT copyright 2016 [employer]. All rights reserved. The codes documented in this report are preliminary and upon coder review may  be revised to meet current compliance requirements. Christena DeemMartin U Skulskie, MD 07/22/2016 9:23:59 AM This report has been signed electronically. Number of Addenda: 0 Note Initiated On: 07/22/2016 8:57 AM      [facility]

## 2016-07-22 NOTE — Anesthesia Postprocedure Evaluation (Signed)
Anesthesia Post Note  Patient: Katherine Caldwell  Procedure(s) Performed: Procedure(s) (LRB): ESOPHAGOGASTRODUODENOSCOPY (EGD) WITH PROPOFOL (N/A)  Patient location during evaluation: Endoscopy Anesthesia Type: General Level of consciousness: awake and alert Pain management: pain level controlled Vital Signs Assessment: post-procedure vital signs reviewed and stable Respiratory status: spontaneous breathing and respiratory function stable Cardiovascular status: stable Anesthetic complications: no    Last Vitals:  Vitals:   07/22/16 0746 07/22/16 0924  BP: (!) 162/83 119/76  Pulse: 79 95  Resp: 16 10  Temp: 37 C (!) 35.9 C    Last Pain:  Vitals:   07/22/16 0924  TempSrc: Tympanic                 KEPHART,WILLIAM K

## 2016-07-22 NOTE — Transfer of Care (Signed)
Immediate Anesthesia Transfer of Care Note  Patient: Katherine Caldwell  Procedure(s) Performed: Procedure(s): ESOPHAGOGASTRODUODENOSCOPY (EGD) WITH PROPOFOL (N/A)  Patient Location: Endoscopy Unit  Anesthesia Type:General  Level of Consciousness: awake, oriented and patient cooperative  Airway & Oxygen Therapy: Patient Spontanous Breathing and Patient connected to nasal cannula oxygen  Post-op Assessment: Report given to RN, Post -op Vital signs reviewed and stable and Patient moving all extremities X 4  Post vital signs: Reviewed and stable  Last Vitals:  Vitals:   07/22/16 0746  BP: (!) 162/83  Pulse: 79  Resp: 16  Temp: 37 C    Last Pain:  Vitals:   07/22/16 0746  TempSrc: Tympanic         Complications: No apparent anesthesia complications

## 2016-07-25 ENCOUNTER — Encounter: Payer: Self-pay | Admitting: Gastroenterology

## 2016-07-26 LAB — SURGICAL PATHOLOGY

## 2016-11-17 ENCOUNTER — Other Ambulatory Visit: Payer: Self-pay | Admitting: Family Medicine

## 2016-11-17 DIAGNOSIS — N644 Mastodynia: Secondary | ICD-10-CM

## 2016-11-23 ENCOUNTER — Other Ambulatory Visit: Payer: Self-pay | Admitting: Family Medicine

## 2016-11-23 DIAGNOSIS — Z1231 Encounter for screening mammogram for malignant neoplasm of breast: Secondary | ICD-10-CM

## 2016-11-28 ENCOUNTER — Ambulatory Visit
Admission: RE | Admit: 2016-11-28 | Discharge: 2016-11-28 | Disposition: A | Discharge status: Home / Self Care | Payer: BLUE CROSS/BLUE SHIELD | Source: Ambulatory Visit | Attending: Family Medicine | Admitting: Family Medicine

## 2016-11-28 DIAGNOSIS — Z1231 Encounter for screening mammogram for malignant neoplasm of breast: Secondary | ICD-10-CM | POA: Diagnosis not present

## 2018-03-13 ENCOUNTER — Other Ambulatory Visit: Payer: Self-pay | Admitting: Family Medicine

## 2018-03-13 DIAGNOSIS — Z1231 Encounter for screening mammogram for malignant neoplasm of breast: Secondary | ICD-10-CM

## 2018-03-20 ENCOUNTER — Encounter (INDEPENDENT_AMBULATORY_CARE_PROVIDER_SITE_OTHER): Payer: Self-pay

## 2018-03-20 ENCOUNTER — Ambulatory Visit
Admission: RE | Admit: 2018-03-20 | Discharge: 2018-03-20 | Disposition: A | Discharge status: Home / Self Care | Payer: BLUE CROSS/BLUE SHIELD | Source: Ambulatory Visit | Attending: Family Medicine | Admitting: Family Medicine

## 2018-03-20 DIAGNOSIS — Z1231 Encounter for screening mammogram for malignant neoplasm of breast: Secondary | ICD-10-CM | POA: Diagnosis not present

## 2018-07-12 ENCOUNTER — Encounter: Payer: Self-pay | Admitting: Emergency Medicine

## 2018-07-12 ENCOUNTER — Other Ambulatory Visit: Payer: Self-pay

## 2018-07-12 ENCOUNTER — Ambulatory Visit
Admission: EM | Admit: 2018-07-12 | Discharge: 2018-07-12 | Disposition: A | Discharge status: Home / Self Care | Payer: BLUE CROSS/BLUE SHIELD | Attending: Family Medicine | Admitting: Family Medicine

## 2018-07-12 DIAGNOSIS — H1012 Acute atopic conjunctivitis, left eye: Secondary | ICD-10-CM

## 2018-07-12 MED ORDER — OLOPATADINE HCL 0.2 % OP SOLN: OPHTHALMIC | 0 refills | Status: AC

## 2018-07-12 NOTE — ED Provider Notes (Signed)
[facility]    CSN: [id number] Arrival date & time: 07/12/18  7829  History   Chief Complaint Chief Complaint  Patient presents with  . Eye Problem   HPI  62 year old female presents with the above complaint.  Woke up this morning with left eye redness and itching.  Patient denies exposure to foreign body.  She states that she came in contact with the dog yesterday.  She states that she has known allergies.  She is unsure if this is contributing.  No vision changes.  No crusting or matting.  No exacerbating relieving factors.  No visual disturbance.  No other associated symptoms.  No other complaints.  PMH, Surgical Hx, Family Hx, Social History reviewed and updated as below.  Past Medical History:  Diagnosis Date  . Diabetes mellitus without complication (HCC)   . Hypertension    Past Surgical History:  Procedure Laterality Date  . COLONOSCOPY    . ESOPHAGOGASTRODUODENOSCOPY (EGD) WITH PROPOFOL N/A 07/22/2016   Procedure: ESOPHAGOGASTRODUODENOSCOPY (EGD) WITH PROPOFOL;  Surgeon: Christena Deem, MD;  Location: [facility] ENDOSCOPY;  Service: Endoscopy;  Laterality: N/A;  . HYSTEROSCOPY W/D&C N/A 06/01/2015   Procedure: DILATATION AND CURETTAGE /HYSTEROSCOPY/MYOSURE;  Surgeon: Suzy Bouchard, MD;  Location: [facility] ORS;  Service: Gynecology;  Laterality: N/A;  . TUBAL LIGATION     OB History   None    Home Medications    Prior to Admission medications   Medication Sig Start Date End Date Taking? Authorizing Provider  amLODipine (NORVASC) 5 MG tablet Take 5 mg by mouth daily.   Yes [provider]  fluticasone (FLONASE) 50 MCG/ACT nasal spray Place 1 spray into both nostrils daily.   Yes [provider]  losartan (COZAAR) 25 MG tablet Take 25 mg by mouth daily.   Yes [provider]  metFORMIN (GLUCOPHAGE-XR) 500 MG 24 hr tablet Take 500 mg by mouth daily with breakfast.   Yes [provider]  omeprazole (PRILOSEC) 40 MG  capsule Take 40 mg by mouth daily.   Yes [provider]  EPINEPHrine (EPIPEN 2-PAK) 0.3 mg/0.3 mL IJ SOAJ injection Inject into the muscle once. As needed    [provider]  Olopatadine HCl 0.2 % SOLN 1 drop to affected eye daily. 07/12/18   Tommie Sams, DO   Family History Family History  Problem Relation Age of Onset  . Breast cancer Neg Hx    Social History Social History   Tobacco Use  . Smoking status: Never Smoker  . Smokeless tobacco: Never Used  Substance Use Topics  . Alcohol use: Yes  . Drug use: No   Allergies   Aspirin and Other  Review of Systems Review of Systems  Constitutional: Negative.   Eyes: Positive for redness and itching. Negative for discharge and visual disturbance.   Physical Exam Triage Vital Signs ED Triage Vitals  Enc Vitals Group     BP 07/12/18 0831 (!) 151/82     Pulse Rate 07/12/18 0831 83     Resp 07/12/18 0831 18     Temp 07/12/18 0831 99.1 F (37.3 C)     Temp Source 07/12/18 0831 Oral     SpO2 07/12/18 0831 98 %     Weight 07/12/18 0829 190 lb (86.2 kg)     Height 07/12/18 0829 5\' 4"  (1.626 m)     Head Circumference --      Peak Flow --      Pain Score 07/12/18 0829 0  Pain Loc --      Pain Edu? --      Excl. in GC? --    Updated Vital Signs BP (!) 151/82 (BP Location: Right Arm)   Pulse 83   Temp 99.1 F (37.3 C) (Oral)   Resp 18   Ht 5\' 4"  (1.626 m)   Wt 86.2 kg   SpO2 98%   BMI 32.61 kg/m   Visual Acuity Right Eye Distance: 20/25 corrected Left Eye Distance: 20/25 corrected Bilateral Distance: 20/25 corrected  Right Eye Near:   Left Eye Near:    Bilateral Near:     Physical Exam  Constitutional: She is oriented to person, place, and time. She appears well-developed. No distress.  HENT:  Head: Normocephalic and atraumatic.  Nose: Nose normal.  Eyes: Left eye exhibits chemosis. Left eye exhibits no discharge. Left conjunctiva is injected.  Pulmonary/Chest: Effort normal. No  respiratory distress.  Neurological: She is alert and oriented to person, place, and time.  Psychiatric: She has a normal mood and affect. Her behavior is normal.  Nursing note and vitals reviewed.  UC Treatments / Results  Labs (all labs ordered are listed, but only abnormal results are displayed) Labs Reviewed - No data to display  EKG None  Radiology No results found.  Procedures Procedures (including critical care time)  Medications Ordered in UC Medications - No data to display  Initial Impression / Assessment and Plan / UC Course  I have reviewed the triage vital signs and the nursing notes.  Pertinent labs & imaging results that were available during my care of the patient were reviewed by me and considered in my medical decision making (see chart for details).    62 year old female presents with suspected allergic conjunctivitis. Treating with pataday.  Final Clinical Impressions(s) / UC Diagnoses   Final diagnoses:  Allergic conjunctivitis of left eye   Discharge Instructions   None    ED Prescriptions    Medication Sig Dispense Auth. Provider   Olopatadine HCl 0.2 % SOLN 1 drop to affected eye daily. 2.5 mL Tommie Samsook, Verdene Creson G, DO     Controlled Substance Prescriptions [place] Controlled Substance Registry consulted? Not Applicable   Tommie SamsCook, Lakyia Behe G, [place] 07/12/18 [id number]

## 2018-07-12 NOTE — ED Triage Notes (Signed)
Patient stated she woke up this morning with her left eye red and itching. Denies visual changes.

## 2019-04-30 ENCOUNTER — Other Ambulatory Visit: Payer: Self-pay | Admitting: Family Medicine

## 2019-04-30 DIAGNOSIS — Z1231 Encounter for screening mammogram for malignant neoplasm of breast: Secondary | ICD-10-CM
